# Patient Record
Sex: Male | Born: 1965 | Race: White | Hispanic: No | Marital: Married | Smoking: Never smoker
Health system: Southern US, Community
[De-identification: ages and names within clinical notes are randomized; demographics above are authoritative.]

## PROBLEM LIST (undated history)

## (undated) DIAGNOSIS — U071 COVID-19: Secondary | ICD-10-CM

## (undated) HISTORY — DX: COVID-19: U07.1

---

## 2003-04-30 ENCOUNTER — Encounter: Payer: Self-pay | Admitting: *Deleted

## 2003-04-30 ENCOUNTER — Emergency Department (HOSPITAL_COMMUNITY): Admission: EM | Admit: 2003-04-30 | Discharge: 2003-04-30 | Payer: Self-pay | Admitting: *Deleted

## 2013-07-10 ENCOUNTER — Telehealth (HOSPITAL_COMMUNITY): Payer: Self-pay | Admitting: *Deleted

## 2013-07-10 NOTE — Telephone Encounter (Signed)
Previous note on this patient regarding Pul rehab was entered in error.  This patient was not contacted.  Cathie Olden RN

## 2013-07-10 NOTE — Telephone Encounter (Signed)
Patient called regarding Pulmonary Rehab referral.  No answer, message left and letter sent to contact us. Cathie Olden RN

## 2020-10-03 ENCOUNTER — Telehealth (HOSPITAL_COMMUNITY): Payer: Self-pay | Admitting: Emergency Medicine

## 2020-10-03 NOTE — Telephone Encounter (Signed)
Called pt and explained possible monoclonal antibody treatment. Sx started 11/22. Tested positive 11/25 with home test. Threw away test and does not have a copy of it. Sx include body aches, fever, chills, cough, and  Headache. Qualifying risk factors second hand smoke exposure as a child and BMI is 30.4. Pt interested in tx. Informed pt an APP will call back to possibly schedule an appointment.

## 2020-10-04 ENCOUNTER — Other Ambulatory Visit (HOSPITAL_COMMUNITY): Payer: Self-pay | Admitting: Physician Assistant

## 2020-10-04 NOTE — Progress Notes (Signed)
I connected by phone with Taylor Moore on 10/04/2020 at 10:14 AM to discuss the potential use of a new treatment for mild to moderate COVID-19 viral infection in non-hospitalized patients.  This patient is a 54 y.o. male that meets the FDA criteria for Emergency Use Authorization of COVID monoclonal antibody casirivimab/imdevimab, bamlanivimab/eteseviamb, or sotrovimab.  Has a (+) direct SARS-CoV-2 viral test result  Has mild or moderate COVID-19   Is NOT hospitalized due to COVID-19  Is within 10 days of symptom onset  Has at least one of the high risk factor(s) for progression to severe COVID-19 and/or hospitalization as defined in EUA.  Specific high risk criteria : BMI > 25   I have spoken and communicated the following to the patient or parent/caregiver regarding COVID monoclonal antibody treatment:  1. FDA has authorized the emergency use for the treatment of mild to moderate COVID-19 in adults and pediatric patients with positive results of direct SARS-CoV-2 viral testing who are 48 years of age and older weighing at least 40 kg, and who are at high risk for progressing to severe COVID-19 and/or hospitalization.  2. The significant known and potential risks and benefits of COVID monoclonal antibody, and the extent to which such potential risks and benefits are unknown.  3. Information on available alternative treatments and the risks and benefits of those alternatives, including clinical trials.  4. Patients treated with COVID monoclonal antibody should continue to self-isolate and use infection control measures (e.g., wear mask, isolate, social distance, avoid sharing personal items, clean and disinfect "high touch" surfaces, and frequent handwashing) according to CDC guidelines.   5. The patient or parent/caregiver has the option to accept or refuse COVID monoclonal antibody treatment.  After reviewing this information with the patient, the patient has agreed to receive one  of the available covid 19 monoclonal antibodies and will be provided an appropriate fact sheet prior to infusion. Jodell Cipro, PA-C 10/04/2020 10:14 AM

## 2020-10-05 ENCOUNTER — Ambulatory Visit (HOSPITAL_COMMUNITY)
Admission: RE | Admit: 2020-10-05 | Discharge: 2020-10-05 | Disposition: A | Discharge status: Home / Self Care | Payer: Managed Care, Other (non HMO) | Source: Ambulatory Visit | Attending: Pulmonary Disease | Admitting: Pulmonary Disease

## 2020-10-05 DIAGNOSIS — U071 COVID-19: Secondary | ICD-10-CM | POA: Diagnosis present

## 2020-10-05 MED ORDER — FAMOTIDINE IN NACL 20-0.9 MG/50ML-% IV SOLN: 20.0000 mg | Freq: Once | INTRAVENOUS | Status: DC | PRN

## 2020-10-05 MED ORDER — SODIUM CHLORIDE 0.9 % IV SOLN: INTRAVENOUS | Status: DC | PRN

## 2020-10-05 MED ORDER — ALBUTEROL SULFATE HFA 108 (90 BASE) MCG/ACT IN AERS: 2.0000 | INHALATION_SPRAY | Freq: Once | RESPIRATORY_TRACT | Status: DC | PRN

## 2020-10-05 MED ORDER — EPINEPHRINE 0.3 MG/0.3ML IJ SOAJ: 0.3000 mg | Freq: Once | INTRAMUSCULAR | Status: DC | PRN

## 2020-10-05 MED ORDER — DIPHENHYDRAMINE HCL 50 MG/ML IJ SOLN: 50.0000 mg | Freq: Once | INTRAMUSCULAR | Status: DC | PRN

## 2020-10-05 MED ORDER — SOTROVIMAB 500 MG/8ML IV SOLN
500.0000 mg | Freq: Once | INTRAVENOUS | Status: AC
  Administered 2020-10-05: 500 mg via INTRAVENOUS

## 2020-10-05 MED ORDER — METHYLPREDNISOLONE SODIUM SUCC 125 MG IJ SOLR: 125.0000 mg | Freq: Once | INTRAMUSCULAR | Status: DC | PRN

## 2020-10-05 NOTE — Progress Notes (Signed)
Diagnosis: COVID-19  Physician: Dr. Patrick Wright  Procedure: Covid Infusion Clinic Med: Sotrovimab infusion - Provided patient with sotrovimab fact sheet for patients, parents, and caregivers prior to infusion.   Complications: No immediate complications noted  Discharge: Discharged home    

## 2020-10-05 NOTE — Discharge Instructions (Signed)
10 Things You Can Do to Manage Your COVID-19 Symptoms at Home If you have possible or confirmed COVID-19: 1. Stay home from work and school. And stay away from other public places. If you must go out, avoid using any kind of public transportation, ridesharing, or taxis. 2. Monitor your symptoms carefully. If your symptoms get worse, call your healthcare provider immediately. 3. Get rest and stay hydrated. 4. If you have a medical appointment, call the healthcare provider ahead of time and tell them that you have or may have COVID-19. 5. For medical emergencies, call 911 and notify the dispatch personnel that you have or may have COVID-19. 6. Cover your cough and sneezes with a tissue or use the inside of your elbow. 7. Wash your hands often with soap and water for at least 20 seconds or clean your hands with an alcohol-based hand sanitizer that contains at least 60% alcohol. 8. As much as possible, stay in a specific room and away from other people in your home. Also, you should use a separate bathroom, if available. If you need to be around other people in or outside of the home, wear a mask. 9. Avoid sharing personal items with other people in your household, like dishes, towels, and bedding. 10. Clean all surfaces that are touched often, like counters, tabletops, and doorknobs. Use household cleaning sprays or wipes according to the label instructions. [email or web address] 05/08/2019 This information is not intended to replace advice given to you by your health care provider. Make sure you discuss any questions you have with your health care provider. Document Revised: 10/10/2019 Document Reviewed: 10/10/2019 Elsevier Patient Education  2020 [employer]. What types of side effects do monoclonal antibody drugs cause?  Common side effects  In general, the more common side effects caused by monoclonal antibody drugs include: . Allergic reactions, such as hives or itching . Flu-like signs and  symptoms, including chills, fatigue, fever, and muscle aches and pains . Nausea, vomiting . Diarrhea . Skin rashes . Low blood pressure   The CDC is recommending patients who receive monoclonal antibody treatments wait at least 90 days before being vaccinated.  Currently, there are no data on the safety and efficacy of mRNA COVID-19 vaccines in persons who received monoclonal antibodies or convalescent plasma as part of COVID-19 treatment. Based on the estimated half-life of such therapies as well as evidence suggesting that reinfection is uncommon in the 90 days after initial infection, vaccination should be deferred for at least 90 days, as a precautionary measure until additional information becomes available, to avoid interference of the antibody treatment with vaccine-induced immune response If you have any questions or concerns after the infusion please call the Advanced Practice Provider on call at [phone]. This number is only intended for your use regarding questions or concerns about the infusion post-treatment side-effects.  Please do not provide this number to others for use.   If someone you know is interested in receiving treatment please have them call the COVID hotline at [phone].

## 2020-10-05 NOTE — Progress Notes (Signed)
Patient reviewed Fact Sheet for Patients, Parents, and Caregivers for Emergency Use Authorization (EUA) of Sotrovimab for the Treatment of Coronavirus. Patient also reviewed and is agreeable to the estimated cost of treatment. Patient is agreeable to proceed.   

## 2020-10-14 ENCOUNTER — Ambulatory Visit (INDEPENDENT_AMBULATORY_CARE_PROVIDER_SITE_OTHER): Payer: Managed Care, Other (non HMO) | Admitting: Nurse Practitioner

## 2020-10-14 ENCOUNTER — Ambulatory Visit (HOSPITAL_COMMUNITY)
Admission: RE | Admit: 2020-10-14 | Discharge: 2020-10-14 | Disposition: A | Discharge status: Home / Self Care | Payer: Managed Care, Other (non HMO) | Source: Ambulatory Visit | Attending: Nurse Practitioner | Admitting: Nurse Practitioner

## 2020-10-14 ENCOUNTER — Other Ambulatory Visit: Payer: Self-pay

## 2020-10-14 VITALS — BP 144/92 | HR 105 | Temp 97.0°F | Ht 71.0 in | Wt 205.0 lb

## 2020-10-14 DIAGNOSIS — R059 Cough, unspecified: Secondary | ICD-10-CM | POA: Diagnosis not present

## 2020-10-14 DIAGNOSIS — Z8616 Personal history of COVID-19: Secondary | ICD-10-CM | POA: Insufficient documentation

## 2020-10-14 MED ORDER — BENZONATATE 100 MG PO CAPS: 100.0000 mg | ORAL_CAPSULE | Freq: Two times a day (BID) | ORAL | 0 refills | Status: AC | PRN

## 2020-10-14 NOTE — Progress Notes (Signed)
 @  Patient ID: Taylor Moore, male    DOB: 10-31-1966, 54 y.o.   MRN: [id number]  Chief Complaint  Patient presents with  . New Patient (Initial Visit)    COVID 11/25 SX: cough, bloody mucous in the morning.     Referring provider: No ref. provider found   54 year old male with no significant health history.  HPI  Patient presents today for post COVID care clinic visit.  He was diagnosed with Covid on 10/01/2020.  He received a monoclonal antibody infusion on 10/05/2020.  He states that over the past 3 days he does seem to be improving.  He does still complain of ongoing cough with green to bloody mucus.  He does have nasal congestion and postnasal drip.  Patient denies any recent fever. Denies f/c/s, n/v/d, hemoptysis, PND, chest pain or edema.        No Known Allergies   There is no immunization history on file for this patient.  Past Medical History:  Diagnosis Date  . COVID-19     Tobacco History: Social History   Tobacco Use  Smoking Status Never Smoker  Smokeless Tobacco Never Used   Counseling given: Yes   Outpatient Encounter Medications as of 10/14/2020  Medication Sig  . benzonatate (TESSALON) 100 MG capsule Take 1 capsule (100 mg total) by mouth 2 (two) times daily as needed for cough.   No facility-administered encounter medications on file as of 10/14/2020.     Review of Systems  Review of Systems  Constitutional: Negative.  Negative for fatigue and fever.  HENT: Positive for congestion and postnasal drip.   Respiratory: Positive for cough. Negative for shortness of breath.   Cardiovascular: Negative.  Negative for chest pain, palpitations and leg swelling.  Gastrointestinal: Negative.   Allergic/Immunologic: Negative.   Neurological: Negative.   Psychiatric/Behavioral: Negative.        Physical Exam  BP (!) 144/92 (BP Location: Right Arm)   Pulse (!) 105   Temp (!) 97 F (36.1 C)   Ht 5\' 11"  (1.803 m)   Wt 205 lb (93 kg)   SpO2  98%   BMI 28.59 kg/m   Wt Readings from Last 5 Encounters:  10/14/20 205 lb (93 kg)     Physical Exam Vitals and nursing note reviewed.  Constitutional:      General: He is not in acute distress.    Appearance: He is well-developed.  Cardiovascular:     Rate and Rhythm: Normal rate and regular rhythm.  Pulmonary:     Effort: Pulmonary effort is normal.     Breath sounds: Rhonchi (right base) present.  Skin:    General: Skin is warm and dry.  Neurological:     Mental Status: He is alert and oriented to person, place, and time.        Assessment & Plan:   History of COVID-19 Cough:   Stay well hydrated  Stay active  Deep breathing exercises  May take tylenol or fever or pain  May take mucinex  twice daily  Will order chest x ray   Follow up:  Follow up if needed      14/08/21, NP 10/14/2020

## 2020-10-14 NOTE — Patient Instructions (Signed)
Covid 19 Cough:   Stay well hydrated  Stay active  Deep breathing exercises  May take tylenol or fever or pain  May take mucinex  twice daily  Will order chest x ray   Follow up:  Follow up if needed

## 2020-10-14 NOTE — Assessment & Plan Note (Signed)
Cough:   Stay well hydrated  Stay active  Deep breathing exercises  May take tylenol or fever or pain  May take mucinex  twice daily  Will order chest x ray   Follow up:  Follow up if needed

## 2020-10-15 ENCOUNTER — Other Ambulatory Visit: Payer: Self-pay | Admitting: Nurse Practitioner

## 2020-10-15 ENCOUNTER — Telehealth: Payer: Self-pay | Admitting: Nurse Practitioner

## 2020-10-15 MED ORDER — PREDNISONE 10 MG PO TABS: ORAL_TABLET | ORAL | 0 refills | Status: AC

## 2020-10-15 MED ORDER — AZITHROMYCIN 250 MG PO TABS: ORAL_TABLET | ORAL | 0 refills | Status: AC

## 2020-10-15 NOTE — Telephone Encounter (Signed)
The patient has been notified of this information and all questions answered. Confirmed pharmacy on file. Follow up appointment was scheduled for 11/12/2020 10:30am.

## 2020-10-15 NOTE — Telephone Encounter (Signed)
-----   Message from Ivonne Andrew, NP sent at 10/15/2020  2:38 PM EST ----- Please call to let patient know that his chest xray did show pneumonia. I will order azithromycin and prednisone. He will need a follow up scheduled in 4 weeks. He will need repeat imaging.

## 2020-11-12 ENCOUNTER — Ambulatory Visit: Payer: Managed Care, Other (non HMO)

## 2021-08-08 IMAGING — DX DG CHEST 2V
2 series · 2 of 2 positions shown · non-contrast
Comparison: None.

CLINICAL DATA: History of [id number], persistent productive cough

EXAM:
CHEST - 2 VIEW

[chest pa]
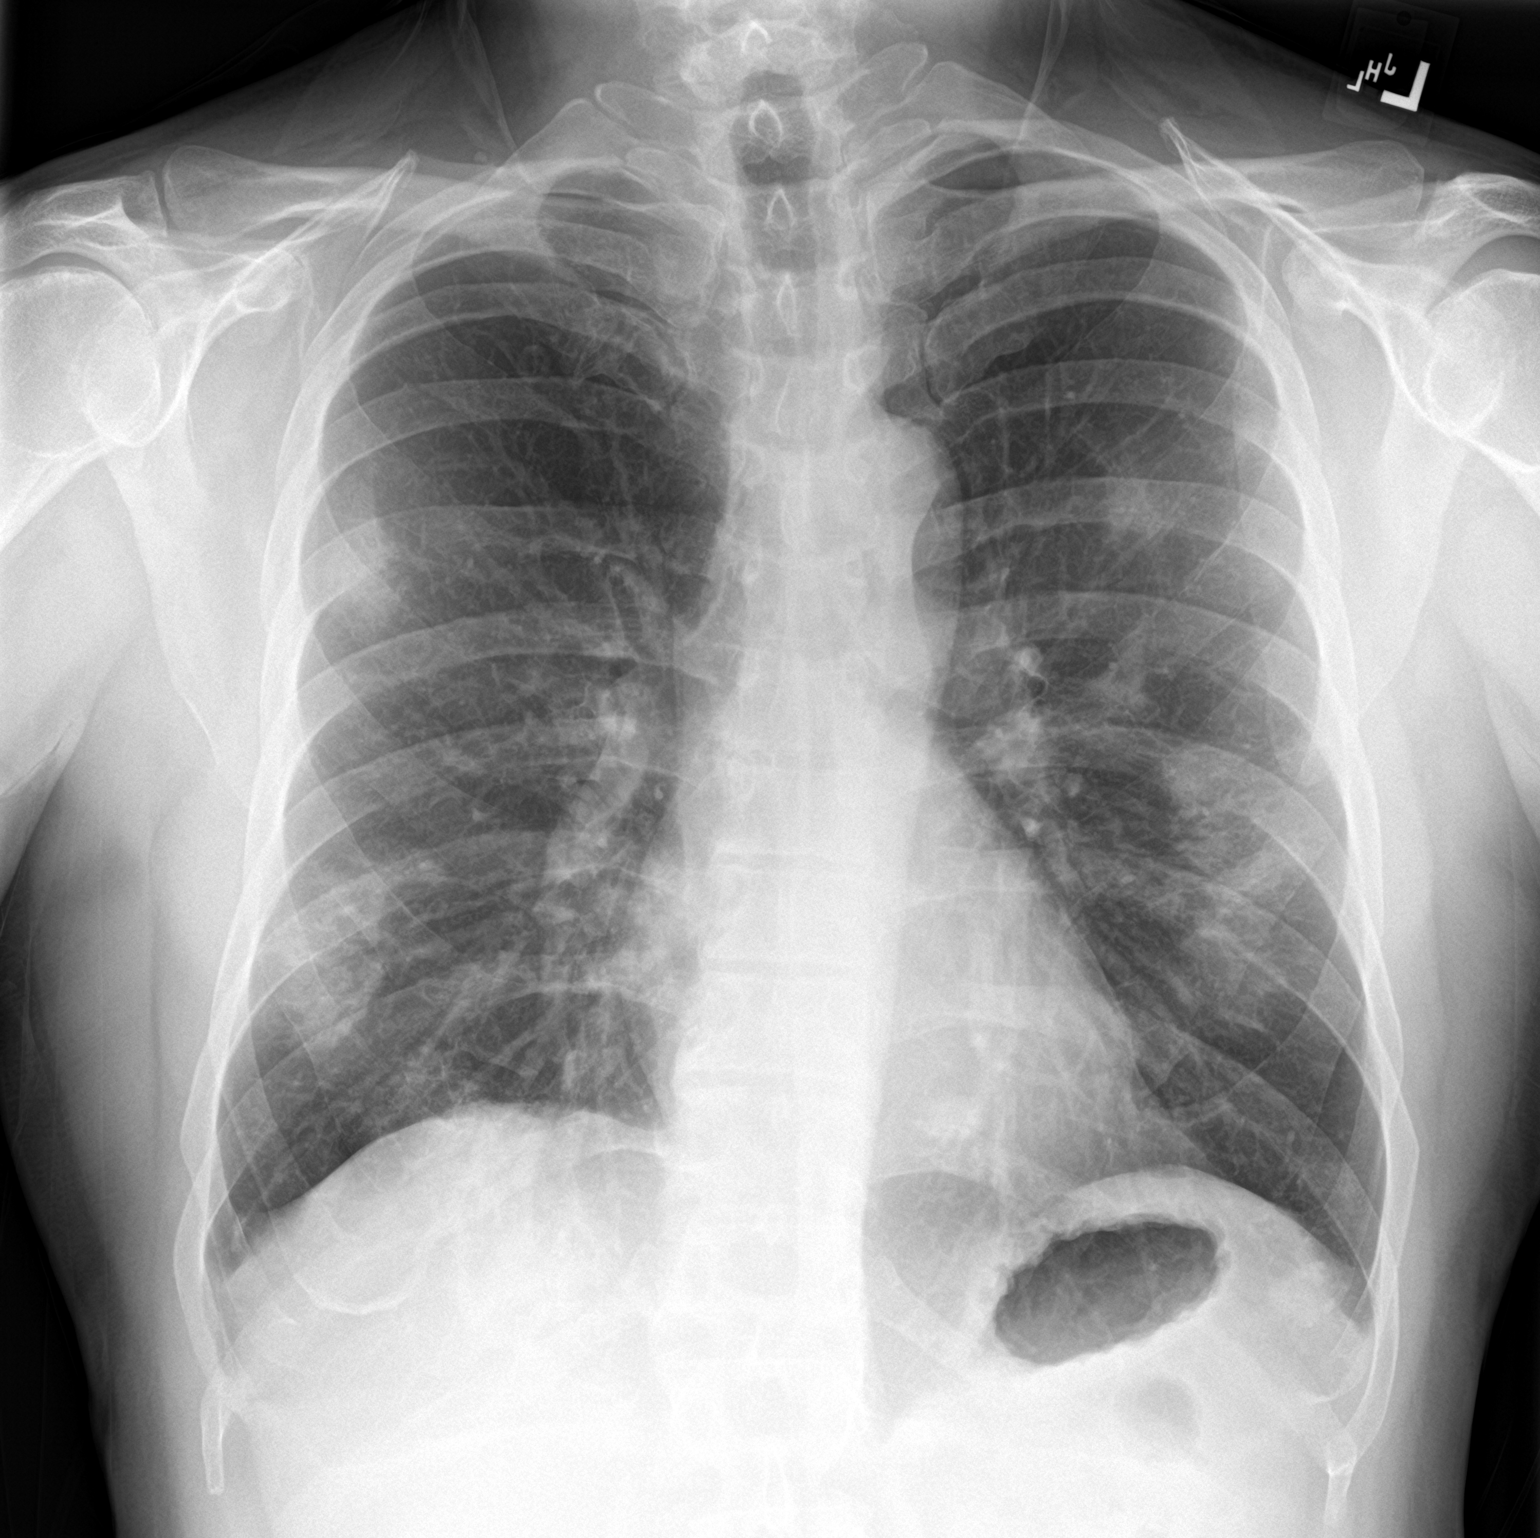

[chest lat]
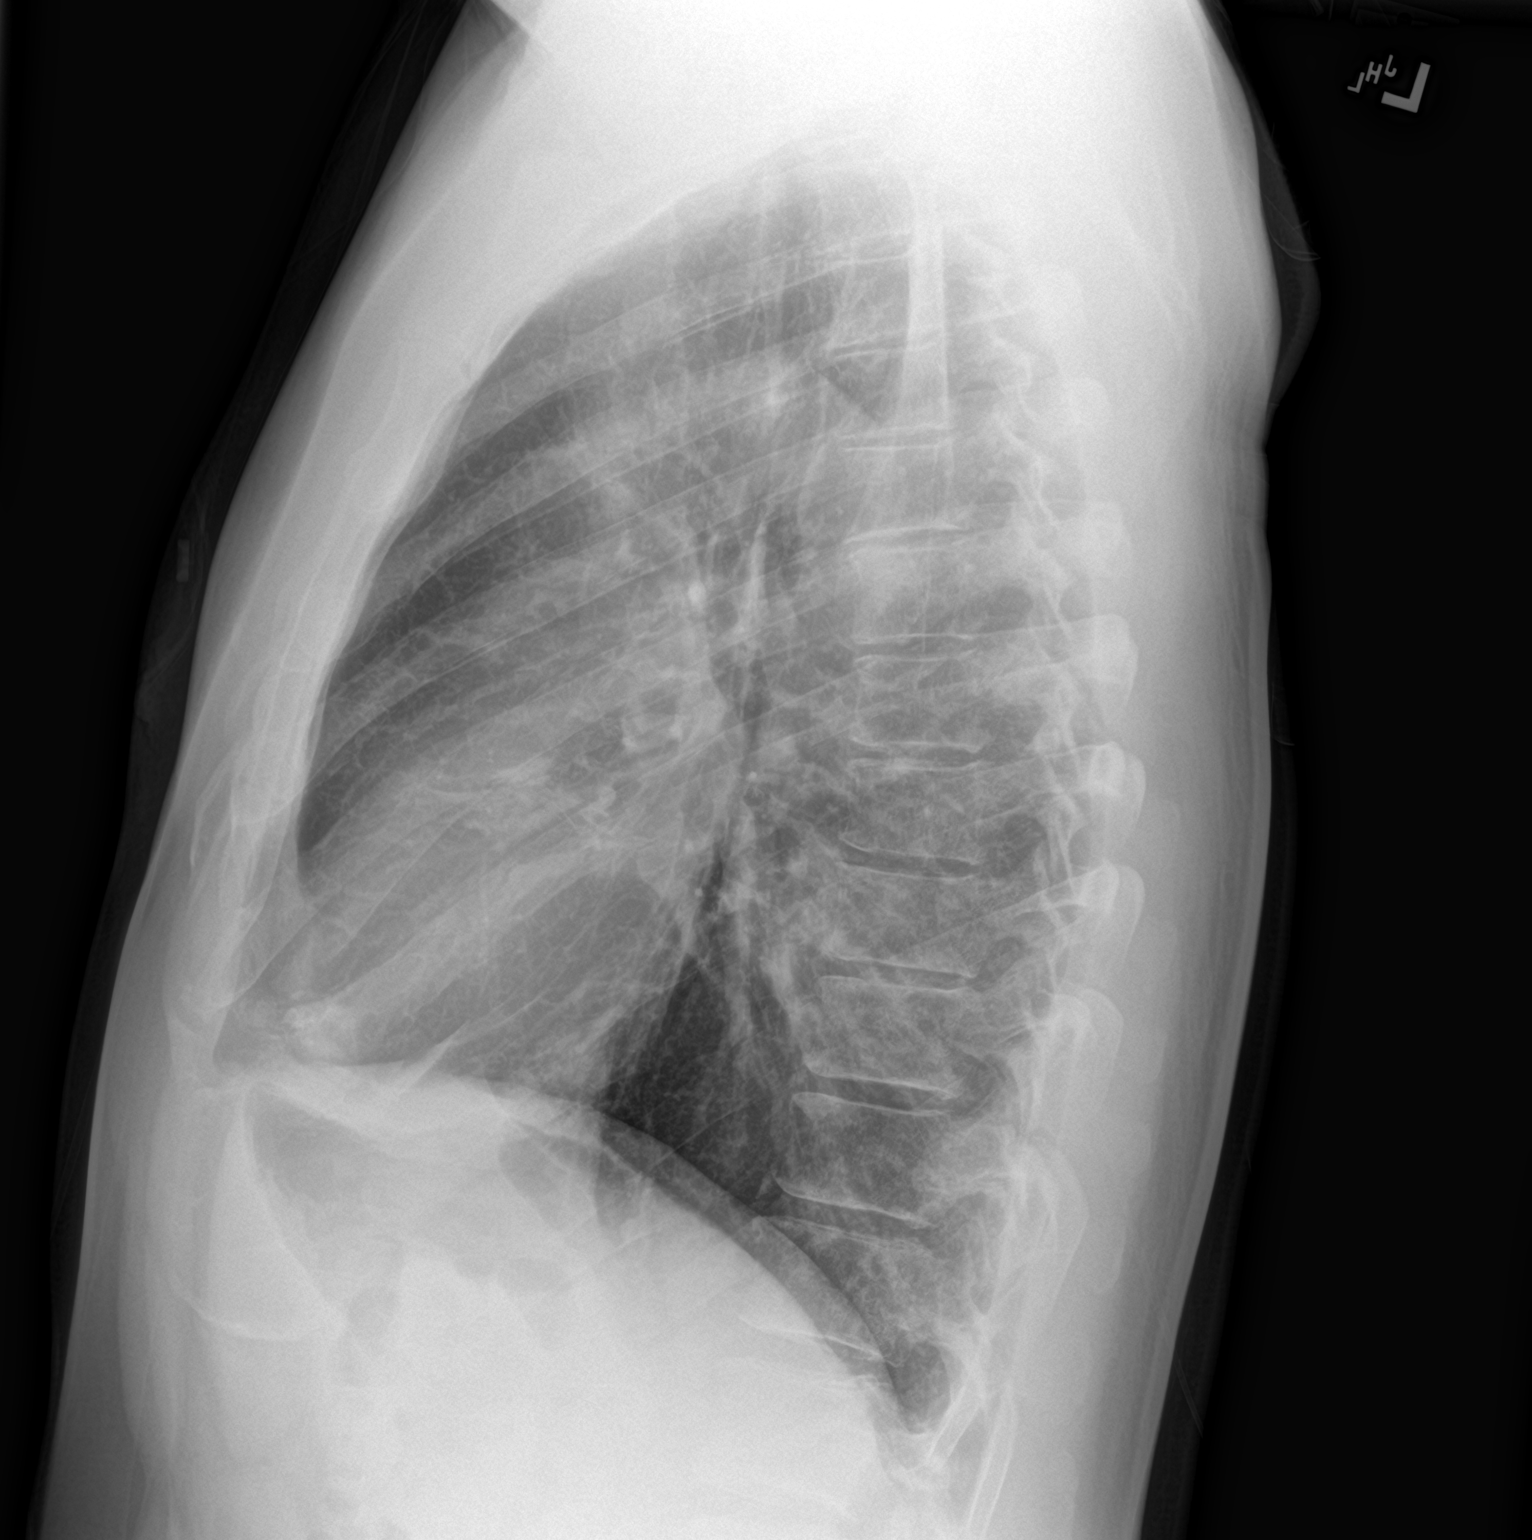

[2 of 2 positions shown; findings below may reference images not displayed]

FINDINGS: Frontal and lateral views of the chest demonstrate an unremarkable
cardiac silhouette. There are nodular areas of consolidation seen
bilaterally in the mid and lower lung zones. While this could
reflect recent history of pneumonia, close follow-up is recommended
to document resolution and exclude underlying neoplasm. No effusion
or pneumothorax. No acute bony abnormalities.
IMPRESSION: 1. Rounded areas of consolidation bilaterally within the mid and
lower lung zones, favor residual multifocal pneumonia. Followup PA
and lateral chest X-ray is recommended in 3-4 weeks following trial
of antibiotic therapy to ensure resolution and exclude underlying
malignancy.
